# Patient Record
Sex: Female | Born: 2000 | Race: White | Hispanic: No | Marital: Single | Smoking: Current some day smoker
Health system: Southern US, Community
[De-identification: ages and names within clinical notes are randomized; demographics above are authoritative.]

## PROBLEM LIST (undated history)

## (undated) DIAGNOSIS — F419 Anxiety disorder, unspecified: Secondary | ICD-10-CM

---
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
## 2023-06-10 ENCOUNTER — Emergency Department (HOSPITAL_COMMUNITY)
Admission: EM | Admit: 2023-06-10 | Discharge: 2023-06-11 | Disposition: A | Discharge status: Home / Self Care | Payer: 59 | Attending: Emergency Medicine | Admitting: Emergency Medicine

## 2023-06-10 ENCOUNTER — Emergency Department (HOSPITAL_COMMUNITY): Payer: 59

## 2023-06-10 ENCOUNTER — Encounter (HOSPITAL_COMMUNITY): Payer: Self-pay | Admitting: *Deleted

## 2023-06-10 ENCOUNTER — Other Ambulatory Visit: Payer: Self-pay

## 2023-06-10 DIAGNOSIS — Y990 Civilian activity done for income or pay: Secondary | ICD-10-CM | POA: Diagnosis not present

## 2023-06-10 DIAGNOSIS — W228XXA Striking against or struck by other objects, initial encounter: Secondary | ICD-10-CM | POA: Diagnosis not present

## 2023-06-10 DIAGNOSIS — R42 Dizziness and giddiness: Secondary | ICD-10-CM | POA: Insufficient documentation

## 2023-06-10 DIAGNOSIS — S0101XA Laceration without foreign body of scalp, initial encounter: Secondary | ICD-10-CM | POA: Insufficient documentation

## 2023-06-10 DIAGNOSIS — S0990XA Unspecified injury of head, initial encounter: Secondary | ICD-10-CM

## 2023-06-10 LAB — CBC WITH DIFFERENTIAL/PLATELET
Abs Immature Granulocytes: 0.05 10*3/uL (ref 0.00–0.07)
Basophils Absolute: 0.1 10*3/uL (ref 0.0–0.1)
Basophils Relative: 1 %
Eosinophils Absolute: 1.2 10*3/uL — ABNORMAL HIGH (ref 0.0–0.5)
Eosinophils Relative: 9 %
HCT: 42.5 % (ref 36.0–46.0)
Hemoglobin: 14.1 g/dL (ref 12.0–15.0)
Immature Granulocytes: 0 %
Lymphocytes Relative: 29 %
Lymphs Abs: 3.8 10*3/uL (ref 0.7–4.0)
MCH: 28.1 pg (ref 26.0–34.0)
MCHC: 33.2 g/dL (ref 30.0–36.0)
MCV: 84.7 fL (ref 80.0–100.0)
Monocytes Absolute: 0.8 10*3/uL (ref 0.1–1.0)
Monocytes Relative: 6 %
Neutro Abs: 7.1 10*3/uL (ref 1.7–7.7)
Neutrophils Relative %: 55 %
Platelets: 310 10*3/uL (ref 150–400)
RBC: 5.02 MIL/uL (ref 3.87–5.11)
RDW: 13.3 % (ref 11.5–15.5)
WBC: 13 10*3/uL — ABNORMAL HIGH (ref 4.0–10.5)
nRBC: 0 % (ref 0.0–0.2)

## 2023-06-10 LAB — BASIC METABOLIC PANEL
Anion gap: 10 (ref 5–15)
BUN: 11 mg/dL (ref 6–20)
CO2: 24 mmol/L (ref 22–32)
Calcium: 9.3 mg/dL (ref 8.9–10.3)
Chloride: 106 mmol/L (ref 98–111)
Creatinine, Ser: 0.75 mg/dL (ref 0.44–1.00)
GFR, Estimated: >60 mL/min (ref >60)
Glucose, Bld: 90 mg/dL (ref 70–99)
Potassium: 4.2 mmol/L (ref 3.5–5.1)
Sodium: 140 mmol/L (ref 135–145)

## 2023-06-10 NOTE — ED Triage Notes (Signed)
The pt fell at work earlier and she struck her head on a hook  and she has a laceration  she has been dizzy after the fall lmp now

## 2023-06-10 NOTE — ED Provider Triage Note (Signed)
Emergency Medicine Provider Triage Evaluation Note  Margaret Marks , a 22 y.o. female  was evaluated in triage.  Pt complains of headache and fall.  Review of Systems  Positive:  Negative:   Physical Exam  BP (!) 144/94   Pulse 88   Temp 98.4 F (36.9 C)   Resp 20   Ht 6\' 1"  (1.854 m)   Wt 123.2 kg   LMP 06/10/2023   SpO2 98%   BMI 35.83 kg/m  Gen:   Awake, no distress   Resp:  Normal effort  MSK:   Moves extremities without difficulty  Other:    Medical Decision Making  Medically screening exam initiated at 9:42 PM.  Appropriate orders placed.  Margaret Marks was informed that the remainder of the evaluation will be completed by another provider, this initial triage assessment does not replace that evaluation, and the importance of remaining in the ED until their evaluation is complete.  Complaining of mechanical fall and headache. Patient struck her head on a wall hook around 8PM. At first patient had pins-and-needles in extremities, vision changes, and dizziness, nausea - most symptoms have resolved. Patient still with LE pin-and-needles and nausea. No blood thinners.  Patient with laceration on back of head - no active bleeding.    Dorthy Cooler, [place] 06/10/23 2147

## 2023-06-11 MED ORDER — LIDOCAINE-EPINEPHRINE-TETRACAINE (LET) TOPICAL GEL
3.0000 mL | Freq: Once | TOPICAL | Status: AC
  Administered 2023-06-11: 3 mL via TOPICAL

## 2023-06-11 MED ORDER — LIDOCAINE-EPINEPHRINE-TETRACAINE (LET) TOPICAL GEL
TOPICAL | Status: AC
  Filled 2023-06-11: qty 3

## 2023-06-11 NOTE — ED Provider Notes (Signed)
[place] EMERGENCY DEPARTMENT AT [facility] Provider Note   CSN: [id number] Arrival date & time: 06/10/23  2051     History  Chief Complaint  Patient presents with   Head Injury    Margaret Marks is a 22 y.o. female.  Presents to the emergency department for evaluation of head injury.  Patient reports that she lost her balance at work and hit her head on a metal hook.  She suffered a laceration to the back of the head.  She has had some dizziness since the injury.  Tetanus up-to-date.       Home Medications Prior to Admission medications   Medication Sig Start Date End Date Taking? Authorizing Provider  ARIPiprazole (ABILIFY) 15 MG tablet Take 0.5 tablets (7.5 mg total) by mouth at bedtime. 03/21/17   Denzil Magnuson, NP  buPROPion (WELLBUTRIN XL) 300 MG 24 hr tablet Take 300 mg by mouth daily.    [provider]  FLUoxetine (PROZAC) 40 MG capsule Take 1 capsule (40 mg total) by mouth daily. 03/22/17   Denzil Magnuson, NP  hydrOXYzine (ATARAX/VISTARIL) 25 MG tablet Take 1 tablet (25 mg total) by mouth 3 (three) times daily as needed for anxiety. 03/21/17   Denzil Magnuson, NP  naproxen sodium (ALEVE) 220 MG tablet Take 440 mg by mouth 2 (two) times daily as needed (pain/headache).    [provider]  omega-3 acid ethyl esters (LOVAZA) 1 g capsule Take 1 capsule (1 g total) by mouth 2 (two) times daily. 03/21/17   Denzil Magnuson, NP  prazosin (MINIPRESS) 1 MG capsule Take 1 capsule (1 mg total) by mouth at bedtime. 03/21/17   Denzil Magnuson, NP  simvastatin (ZOCOR) 10 MG tablet Take 1 tablet (10 mg total) by mouth at bedtime. 03/21/17   Denzil Magnuson, NP      Allergies    Penicillins    Review of Systems   Review of Systems  Physical Exam Updated Vital Signs BP 132/80 (BP Location: Left Arm)   Pulse 94   Temp 98.5 F (36.9 C)   Resp 17   Ht 6\' 1"  (1.854 m)   Wt 123.2 kg   LMP 06/10/2023   SpO2 99%   BMI 35.83 kg/m  Physical  Exam Vitals and nursing note reviewed.  Constitutional:      General: She is not in acute distress.    Appearance: She is well-developed.  HENT:     Head: Normocephalic and atraumatic.      Mouth/Throat:     Mouth: Mucous membranes are moist.  Eyes:     General: Vision grossly intact. Gaze aligned appropriately.     Extraocular Movements: Extraocular movements intact.     Conjunctiva/sclera: Conjunctivae normal.  Cardiovascular:     Rate and Rhythm: Normal rate and regular rhythm.     Pulses: Normal pulses.     Heart sounds: Normal heart sounds, S1 normal and S2 normal. No murmur heard.    No friction rub. No gallop.  Pulmonary:     Effort: Pulmonary effort is normal. No respiratory distress.     Breath sounds: Normal breath sounds.  Abdominal:     General: Bowel sounds are normal.     Palpations: Abdomen is soft.     Tenderness: There is no abdominal tenderness. There is no guarding or rebound.     Hernia: No hernia is present.  Musculoskeletal:        General: No swelling.     Cervical back: Full passive  range of motion without pain, normal range of motion and neck supple. No spinous process tenderness or muscular tenderness. Normal range of motion.     Right lower leg: No edema.     Left lower leg: No edema.  Skin:    General: Skin is warm and dry.     Capillary Refill: Capillary refill takes less than 2 seconds.     Findings: No ecchymosis, erythema, rash or wound.  Neurological:     General: No focal deficit present.     Mental Status: She is alert and oriented to person, place, and time.     GCS: GCS eye subscore is 4. GCS verbal subscore is 5. GCS motor subscore is 6.     Cranial Nerves: Cranial nerves 2-12 are intact.     Sensory: Sensation is intact.     Motor: Motor function is intact.     Coordination: Coordination is intact.  Psychiatric:        Attention and Perception: Attention normal.        Mood and Affect: Mood normal.        Speech: Speech normal.         Behavior: Behavior normal.     ED Results / Procedures / Treatments   Labs (all labs ordered are listed, but only abnormal results are displayed) Labs Reviewed  CBC WITH DIFFERENTIAL/PLATELET - Abnormal; Notable for the following components:      Result Value   WBC 13.0 (*)    Eosinophils Absolute 1.2 (*)    All other components within normal limits  BASIC METABOLIC PANEL    EKG None  Radiology CT Head Wo Contrast  Result Date: 06/10/2023 CLINICAL DATA:  Head trauma, workplace accident, head injury. EXAM: CT HEAD WITHOUT CONTRAST TECHNIQUE: Contiguous axial images were obtained from the base of the skull through the vertex without intravenous contrast. RADIATION DOSE REDUCTION: This exam was performed according to the departmental dose-optimization program which includes automated exposure control, adjustment of the mA and/or kV according to patient size and/or use of iterative reconstruction technique. COMPARISON:  None Available. FINDINGS: Brain: No acute intracranial hemorrhage, midline shift or mass effect. No extra-axial fluid collection. Gray-white matter differentiation is within normal limits. No hydrocephalus. Vascular: No hyperdense vessel or unexpected calcification. Skull: Normal. Negative for fracture or focal lesion. Sinuses/Orbits: Mucosal thickening and air-fluid levels are noted in the left maxillary sinus. No acute orbital abnormality. Other: None. IMPRESSION: 1. No acute intracranial process. 2. Mucosal thickening and air-fluid levels in the left maxillary sinus. Electronically Signed   By: Thornell Sartorius M.D.   On: 06/10/2023 23:47    Procedures .Marland KitchenLaceration Repair  Date/Time: 06/11/2023 3:50 AM  Performed by: Gilda Crease, MD Authorized by: Gilda Crease, MD   Consent:    Consent obtained:  Verbal   Consent given by:  Patient   Risks, benefits, and alternatives were discussed: yes     Risks discussed:  Infection, pain and retained foreign  body Universal protocol:    Procedure explained and questions answered to patient or proxy's satisfaction: yes     Relevant documents present and verified: yes     Test results available: yes     Imaging studies available: yes     Required blood products, implants, devices, and special equipment available: yes     Site/side marked: yes     Immediately prior to procedure, a time out was called: yes     Patient identity confirmed:  Verbally with  patient Anesthesia:    Anesthesia method:  Topical application   Topical anesthetic:  LET Laceration details:    Location:  Scalp   Scalp location:  Occipital   Length (cm):  2 Pre-procedure details:    Preparation:  Patient was prepped and draped in usual sterile fashion and imaging obtained to evaluate for foreign bodies Exploration:    Contaminated: no   Treatment:    Area cleansed with:  Chlorhexidine   Irrigation solution:  Sterile saline Skin repair:    Repair method:  Staples   Number of staples:  3 Approximation:    Approximation:  Close Repair type:    Repair type:  Simple Post-procedure details:    Procedure completion:  Tolerated well, no immediate complications     Medications Ordered in ED Medications  lidocaine-EPINEPHrine-tetracaine (LET) topical gel (3 mLs Topical Given 06/11/23 0303)    ED Course/ Medical Decision Making/ A&P                             Medical Decision Making  Differential diagnosis considered includes, but not limited to: Laceration; intracranial injury; concussion  Presents for evaluation after head injury.  Patient hit her head earlier today.  She suffered a laceration to the occipital scalp.  This was repaired with staples.  CT head unremarkable.  Neurologic exam unremarkable.        Final Clinical Impression(s) / ED Diagnoses Final diagnoses:  Injury of head, initial encounter  Scalp laceration, initial encounter    Rx / DC Orders ED Discharge Orders     None          Mendi Constable, Canary Brim, MD 06/11/23 [id number]
